# Patient Record
Sex: Male | Born: 1995
Health system: Southern US, Community
[De-identification: ages and names within clinical notes are randomized; demographics above are authoritative.]

## PROBLEM LIST (undated history)

## (undated) HISTORY — PX: WISDOM TOOTH EXTRACTION: SHX21

---
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
## 2017-11-07 ENCOUNTER — Encounter: Payer: Self-pay | Admitting: Neurology

## 2018-01-19 ENCOUNTER — Encounter: Payer: Self-pay | Admitting: Neurology

## 2018-01-19 ENCOUNTER — Other Ambulatory Visit: Payer: Self-pay

## 2018-01-19 ENCOUNTER — Other Ambulatory Visit: Payer: BC Managed Care – PPO

## 2018-01-19 ENCOUNTER — Ambulatory Visit: Payer: BC Managed Care – PPO | Admitting: Neurology

## 2018-01-19 VITALS — BP 124/78 | HR 58 | Ht 66.0 in | Wt 175.0 lb

## 2018-01-19 DIAGNOSIS — R519 Headache, unspecified: Secondary | ICD-10-CM

## 2018-01-19 DIAGNOSIS — G47411 Narcolepsy with cataplexy: Secondary | ICD-10-CM | POA: Diagnosis not present

## 2018-01-19 DIAGNOSIS — R51 Headache: Secondary | ICD-10-CM

## 2018-01-19 NOTE — Patient Instructions (Addendum)
1. Bloodwork for CMP, TSH, CK  Your provider has requested that you have labwork completed today. Please go to [facility] ([id number]) on the second floor of this building before leaving the office today. You do not need to check in. If you are not called within 15 minutes please check with the front desk.   2. Schedule MRI brain with and without contrast.[facility] and they will call you directly to schedule your appt. They are located at [address] If you need to contact them directly please call [phone].   3. Schedule EMG/NCV of right UE and LE with Dr. Allena KatzPatel 4. Follow-up after tests

## 2018-01-19 NOTE — Progress Notes (Signed)
NEUROLOGY CONSULTATION NOTE  Leroy Herman MRN: [id number] DOB: Oct 19, 1996  Referring provider: Dr. Pati Gallo Primary care provider: Dr. Ronnette Juniper  Reason for consult:  Temporary paralysis  Dear Dr Farris Has:  Thank you for your kind referral of Leroy Herman for consultation of the above symptoms. Although his history is well known to you, please allow me to reiterate it for the purpose of our medical record. He is alone in the office today. Records and images were personally reviewed where available.   HISTORY OF PRESENT ILLNESS: This is a pleasant 22 year old right-handed man with no significant past medical history, presenting for evaluation of transient paralysis that only occurs when he is on the football field after a tackle. He had one or two at age 65 while playing football, then in the past 2 years has had 4 episodes. With all of the episodes, he would be tackling somebody and fall to the ground, once on the ground he feels stuck, "whole body shuts off," and he cannot move all his extremities for 5 seconds. It happened twice in one game last November 2018, with the first one he got up by himself after 5 seconds, with the second one he started to get up as the coach was coming toward him. He has not tried to talk, he denies any confusion and knows what is going on but feels frozen. He is normal soon after and able to continue playing. He denies any muscle cramps or twitching, no focal numbness/tingling. It does not happen each time he is tackled, there are no clear triggers (ie prior food intake, sleep). He has noticed memory issues since November 2018, he is able to keep up his grades but has difficulty remembering things, stuttering, having more mooc changes. He has had sleep difficulties since Spring break, usually getting only 2-3 hours of sleep at night. No daytime drowsiness. He denies any sleep paralysis, hallucinations. No family history of similar symptoms. He started  noticing more headaches as well since November, with a headache at least every 2 days. He describes a throbbing ache in the frontal region lasting for a few hours, no nausea/vomiting, photo/phonophobia, visual obscurations. He is able to function with the headaches. No dizziness, diplopia, bowel/bladder dysfunction. He has occasional neck and back pain. He saw Ortho for these episodes and had an MRI cervical spine in 09/2017 which was normal.    PAST MEDICAL HISTORY: History reviewed. No pertinent past medical history.  PAST SURGICAL HISTORY: Past Surgical History:  Procedure Laterality Date  . WISDOM TOOTH EXTRACTION      MEDICATIONS: Current Outpatient Medications on File Prior to Visit  Medication Sig Dispense Refill  . ibuprofen (ADVIL,MOTRIN) 800 MG tablet Take 1 tablet (800 mg total) by mouth 3 (three) times daily. 50 tablet 0  . Loratadine (CLARITIN) 10 MG CAPS Take by mouth.     No current facility-administered medications on file prior to visit.     ALLERGIES: No Known Allergies  FAMILY HISTORY: History reviewed. No pertinent family history.  SOCIAL HISTORY: Social History   Socioeconomic History  . Marital status: Single    Spouse name: Not on file  . Number of children: Not on file  . Years of education: Not on file  . Highest education level: Not on file  Social Needs  . Financial resource strain: Not on file  . Food insecurity - worry: Not on file  . Food insecurity - inability: Not on file  . Transportation  needs - medical: Not on file  . Transportation needs - non-medical: Not on file  Occupational History  . Not on file  Tobacco Use  . Smoking status: Never Smoker  . Smokeless tobacco: Never Used  Substance and Sexual Activity  . Alcohol use: No  . Drug use: Not on file  . Sexual activity: Not on file  Other Topics Concern  . Not on file  Social History Narrative   Pt lives in college dorm at [employer]   Has 3 dorm-mates (single room  occupancy 4 total to dorm)    REVIEW OF SYSTEMS: Constitutional: No fevers, chills, or sweats, no generalized fatigue, change in appetite Eyes: No visual changes, double vision, eye pain Ear, nose and throat: No hearing loss, ear pain, nasal congestion, sore throat Cardiovascular: No chest pain, palpitations Respiratory:  No shortness of breath at rest or with exertion, wheezes GastrointestinaI: No nausea, vomiting, diarrhea, abdominal pain, fecal incontinence Genitourinary:  No dysuria, urinary retention or frequency Musculoskeletal:  No neck pain, back pain Integumentary: No rash, pruritus, skin lesions Neurological: as above Psychiatric: No depression, insomnia, anxiety Endocrine: No palpitations, fatigue, diaphoresis, mood swings, change in appetite, change in weight, increased thirst Hematologic/Lymphatic:  No anemia, purpura, petechiae. Allergic/Immunologic: no itchy/runny eyes, nasal congestion, recent allergic reactions, rashes  PHYSICAL EXAM: Vitals:   01/19/18 1026  BP: 124/78  Pulse: (!) 58  SpO2: 98%   General: No acute distress Head:  Normocephalic/atraumatic Eyes: Fundoscopic exam shows bilateral sharp discs, no vessel changes, exudates, or hemorrhages Neck: supple, no paraspinal tenderness, full range of motion Back: No paraspinal tenderness Heart: regular rate and rhythm Lungs: Clear to auscultation bilaterally. Vascular: No carotid bruits. Skin/Extremities: No rash, no edema Neurological Exam: Mental status: alert and oriented to person, place, and time, no dysarthria or aphasia, Fund of knowledge is appropriate.  Recent and remote memory are intact.  Attention and concentration are normal.    Able to name objects and repeat phrases. Cranial nerves: CN I: not tested CN II: pupils equal, round and reactive to light, visual fields intact, fundi unremarkable. CN III, IV, VI:  full range of motion, no nystagmus, no ptosis CN V: facial sensation intact CN VII:  upper and lower face symmetric CN VIII: hearing intact to finger rub CN IX, X: gag intact, uvula midline CN XI: sternocleidomastoid and trapezius muscles intact CN XII: tongue midline Bulk & Tone: normal, no fasciculations. Motor: 5/5 throughout with no pronator drift. Sensation: intact to light touch, cold, pin, vibration and joint position sense.  No extinction to double simultaneous stimulation.  Romberg test negative Deep Tendon Reflexes: +2 throughout, no ankle clonus Plantar responses: downgoing bilaterally Cerebellar: no incoordination on finger to nose, heel to shin. No dysdiadochokinesia Gait: narrow-based and steady, able to tandem walk adequately. Tremor: none  IMPRESSION: This is a pleasant 22 year old right-handed man presenting with recurrent episodes of transient paralysis when he is tackled to the ground playing football. These episodes have not occurred at any other times, and do not occur all the time he is tackled to the ground. His neurological exam is normal. Etiology of symptoms unclear, considerations include periodic paralysis from a channelopathy, although paralysis with these conditions tend to be more prolonged than 5 seconds. Check CMP, CK, TSH. EMG/NCV of the right UE and LE will be ordered to assess for neuropathy/myopathy. Other considerations include cataplexy, he has no other symptoms of narcolepsy, ?cardiac cause in a young athlete, although he does not seem to  have any other cardiac symptoms. He is also reporting new onset headaches and memory changes, MRI brain with and without contrast will be ordered to assess for underlying structural abnormality. He will follow-up after the tests.  Thank you for allowing me to participate in the care of this patient. Please do not hesitate to call for any questions or concerns.   Patrcia Dolly, M.D.  CC: Dr. Farris Has, Dr. Tracey Harries

## 2018-01-20 LAB — COMPREHENSIVE METABOLIC PANEL
AG Ratio: 1.9 (calc) (ref 1.0–2.5)
ALKALINE PHOSPHATASE (APISO): 57 U/L (ref 48–230)
ALT: 35 U/L (ref 8–46)
AST: 25 U/L (ref 12–32)
Albumin: 4.8 g/dL (ref 3.6–5.1)
BUN: 16 mg/dL (ref 7–20)
CO2: 31 mmol/L (ref 20–32)
Calcium: 10 mg/dL (ref 8.9–10.4)
Chloride: 102 mmol/L (ref 98–110)
Creat: 1.02 mg/dL (ref 0.60–1.26)
Globulin: 2.5 g/dL (calc) (ref 2.1–3.5)
Glucose, Bld: 88 mg/dL (ref 65–99)
Potassium: 4.3 mmol/L (ref 3.8–5.1)
Sodium: 139 mmol/L (ref 135–146)
Total Bilirubin: 0.5 mg/dL (ref 0.2–1.1)
Total Protein: 7.3 g/dL (ref 6.3–8.2)

## 2018-01-20 LAB — CK: CK TOTAL: 239 U/L — AB (ref 44–196)

## 2018-01-20 LAB — TSH: TSH: 2.12 m[IU]/L (ref 0.50–4.30)

## 2018-01-27 ENCOUNTER — Ambulatory Visit (INDEPENDENT_AMBULATORY_CARE_PROVIDER_SITE_OTHER): Payer: BC Managed Care – PPO | Admitting: Neurology

## 2018-01-27 DIAGNOSIS — G47411 Narcolepsy with cataplexy: Secondary | ICD-10-CM | POA: Diagnosis not present

## 2018-01-27 DIAGNOSIS — R519 Headache, unspecified: Secondary | ICD-10-CM

## 2018-01-27 DIAGNOSIS — R531 Weakness: Secondary | ICD-10-CM

## 2018-01-27 DIAGNOSIS — R51 Headache: Secondary | ICD-10-CM

## 2018-01-27 NOTE — Procedures (Signed)
[facility]  [address] [place], [address]  [place], [place] [address] Tel: [phone] Fax:  [phone] Test Date:  01/27/2018  Patient: Leroy Herman DOB: 1996/07/28 Physician: Nita Sickle, DO  Sex: Male Height: 5\' 6"  Ref Phys: Patrcia Dolly, MD  ID#: [id number] Temp: 32.4C Technician:    Patient Complaints: This is a 22 year old man referred for evaluation of transient muscle weakness and falls.  NCV & EMG Findings: Extensive electrodiagnostic testing of the right upper and lower extremity shows:  1. All sensory responses including the right median, ulnar, sural, and superficial peroneal nerves are within normal limits. 2. All motor responses including the right median, ulnar, peroneal, and tibial nerves are within normal limits. 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 3. Right tibial H reflex study is within normal limits. 4. There is no evidence of active or chronic motor axon loss changes affecting any of the tested muscles. Motor unit configuration and recruitment pattern is within normal limits.  Impression: This is a normal study of the right side. In particular, there is no evidence of a diffuse myopathy, sensorimotor polyneuropathy, or cervical/lumbosacral radiculopathy.  Incidentally, there is a right Martin-Gruber anastomosis, a normal variant.   ___________________________ Nita Sickle, DO    Nerve Conduction Studies Anti Sensory Summary Table   Site NR Peak (ms) Norm Peak (ms) P-T Amp (V) Norm P-T Amp  Right Median Anti Sensory (2nd Digit)  32.4C  Wrist    2.6 <3.3 44.8 >20  Right Sup Peroneal Anti Sensory (Ant Lat Mall)  32.4C  12 cm    2.6 <4.4 24.5 >6  Right Sural Anti Sensory (Lat Mall)  32.4C  Calf    2.7 <4.4 15.2 >6  Right Ulnar Anti Sensory (5th Digit)  32.4C    Wrist    2.4 <3.0 22.0 >18   Motor Summary Table   Site NR Onset (ms) Norm Onset (ms) O-P Amp (mV) Norm O-P Amp Site1 Site2 Delta-0 (ms) Dist (cm) Vel (m/s) Norm Vel (m/s)  Right Median Motor (Abd Poll Brev)  32.4C  Wrist    2.7 <3.9 7.4 >6 Elbow Wrist 4.1 26.0 63 >51  Elbow    6.8  8.2  Ulnar-wrist crossover Elbow 2.7 0.0    Ulnar-wrist crossover    4.1  2.9         Right Peroneal Motor (Ext Dig Brev)  32.4C  Ankle    2.8 <5.5 7.5 >3 B Fib Ankle 6.8 38.0 56 >41  B Fib    9.6  6.6  Poplt B Fib 1.3 8.0 62 >41  Poplt    10.9  6.3         Right Peroneal TA Motor (Tib Ant)  32.4C  Fib Head    1.8 <4.0 8.2 >4 Poplit Fib Head 1.4 8.0 57 >41  Poplit    3.2  8.2         Right Tibial Motor (Abd Hall Brev)  32.4C  Ankle    2.7 <5.8 15.0 >8 Knee Ankle 7.5 40.0 53 >41  Knee    10.2  12.1         Right Ulnar Motor (Abd Dig Minimi)  32.4C  Wrist    2.3 <3.0 10.6 >8 B Elbow Wrist 3.6 22.0 61 >51  B Elbow    5.9  9.7  A  Elbow B Elbow 1.8 10.0 56 >51  A Elbow    7.7  9.8          H Reflex Studies   NR H-Lat (ms) Lat Norm (ms) L-R H-Lat (ms)  Right Tibial (Gastroc)  32.4C     30.20 <35    EMG   Side Muscle Ins Act Fibs Psw Fasc Number Recrt Dur Dur. Amp Amp. Poly Poly. Comment  Right 1stDorInt Nml Nml Nml Nml Nml Nml Nml Nml Nml Nml Nml Nml N/A  Right PronatorTeres Nml Nml Nml Nml Nml Nml Nml Nml Nml Nml Nml Nml N/A  Right Biceps Nml Nml Nml Nml Nml Nml Nml Nml Nml Nml Nml Nml N/A  Right Triceps Nml Nml Nml Nml Nml Nml Nml Nml Nml Nml Nml Nml N/A  Right Deltoid Nml Nml Nml Nml Nml Nml Nml Nml Nml Nml Nml Nml N/A  Right AntTibialis Nml Nml Nml Nml Nml Nml Nml Nml Nml Nml Nml Nml N/A  Right Gastroc Nml Nml Nml Nml Nml Nml Nml Nml Nml Nml Nml Nml N/A  Right Flex Dig Long Nml Nml Nml Nml Nml Nml Nml Nml Nml Nml Nml Nml N/A  Right RectFemoris Nml Nml Nml Nml Nml Nml Nml Nml Nml Nml Nml Nml N/A  Right GluteusMed Nml Nml Nml Nml Nml Nml Nml Nml Nml Nml Nml Nml N/A      Waveforms:

## 2018-02-04 ENCOUNTER — Telehealth: Payer: Self-pay | Admitting: Neurology

## 2018-02-04 ENCOUNTER — Ambulatory Visit
Admission: RE | Admit: 2018-02-04 | Discharge: 2018-02-04 | Disposition: A | Discharge status: Home / Self Care | Payer: BC Managed Care – PPO | Source: Ambulatory Visit | Attending: Neurology | Admitting: Neurology

## 2018-02-04 DIAGNOSIS — R51 Headache: Secondary | ICD-10-CM

## 2018-02-04 DIAGNOSIS — R519 Headache, unspecified: Secondary | ICD-10-CM

## 2018-02-04 DIAGNOSIS — G47411 Narcolepsy with cataplexy: Secondary | ICD-10-CM

## 2018-02-04 MED ORDER — GADOBENATE DIMEGLUMINE 529 MG/ML IV SOLN
16.0000 mL | Freq: Once | INTRAVENOUS | Status: AC | PRN
  Administered 2018-02-04: 16 mL via INTRAVENOUS

## 2018-02-04 NOTE — Telephone Encounter (Signed)
Pt left a VM message saying he finished all his tests and wanted to schedule an appointment, next available is in October, please advise

## 2018-02-05 NOTE — Telephone Encounter (Addendum)
Notes say to follow up after tests.  First open slot is October, absolute first available is an urgent work in morning on May 23rd. Let me know and I will schedule. Also, MRI impression suggests pt have EEG.

## 2018-02-05 NOTE — Telephone Encounter (Signed)
ptp scheduled for follow up tomorrow at 11:30am

## 2018-02-05 NOTE — Telephone Encounter (Signed)
Ok for 11:30am slot tomorrow. Thanks

## 2018-02-06 ENCOUNTER — Ambulatory Visit: Payer: BC Managed Care – PPO | Admitting: Neurology

## 2018-02-06 ENCOUNTER — Encounter: Payer: Self-pay | Admitting: Neurology

## 2018-02-06 VITALS — BP 118/62 | HR 76 | Resp 16 | Ht 66.0 in | Wt 176.0 lb

## 2018-02-06 DIAGNOSIS — Q279 Congenital malformation of peripheral vascular system, unspecified: Secondary | ICD-10-CM | POA: Diagnosis not present

## 2018-02-06 DIAGNOSIS — R413 Other amnesia: Secondary | ICD-10-CM | POA: Diagnosis not present

## 2018-02-06 DIAGNOSIS — G47411 Narcolepsy with cataplexy: Secondary | ICD-10-CM

## 2018-02-06 NOTE — Progress Notes (Signed)
NEUROLOGY FOLLOW UP OFFICE NOTE  Leroy Herman [id number] 10-20-1996  HISTORY OF PRESENT ILLNESS: I had the pleasure of seeing Leroy Herman in follow-up in the neurology clinic on 02/06/2018.  The patient was last seen 2 weeks ago for recurrent episodes of transient quadriplegia occurring when he is tackled playing football. He was also reporting daily headaches, memory loss, and speech difficulties. Records and images were personally reviewed where available.  Bloodwork showed mildly elevated CK 239, normal CMP and TSH. His EMG/NCV of the right arm and leg were normal, no evidence of myopathy, neuropathy, or cervical/lumbar radiculopathy. 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 He does not have any staring/unresponsive episodes, olfactory/gustatory hallucinations, focal numbness/tingling/weakness. He reports the headaches are not as bad, they are not occurring as frequently. He continues to note memory issues, stating he does well on tests, but stutters a lot when talking to his friends, he has difficulties getting words out. He forgets his age when asked, or forgets what he did yesterday. He has not had any episodes of transient quadriplegia since last visit, he thought he may have an episode one time when he hit a football dummy, but it did not progress.  HPI 01/19/2018: This is a pleasant 22 yo RH man with no significant past medical history, who presented for evaluation of transient paralysis that only occurs when he is on the football field after a tackle. He had one or two at age 22 while playing football, then in the past 20 years has had 4 episodes. With all of the episodes, he would be tackling somebody and fall to the ground, once on the ground he feels stuck, "whole body shuts off," and he cannot  move all his extremities for 5 seconds. It happened twice in one game last November 2018, with the first one he got up by himself after 5 seconds, with the second one he started to get up as the coach was coming toward him. He has not tried to talk, he denies any confusion and knows what is going on but feels frozen. He is normal soon after and able to continue playing. He denies any muscle cramps or twitching, no focal numbness/tingling. It does not happen each time he is tackled, there are no clear triggers (ie prior food intake, sleep). He has noticed memory issues since November 2018, he is able to keep up his grades but has difficulty remembering things, stuttering, having more mood changes. He has had sleep difficulties since Spring break, usually getting only 2-3 hours of sleep at night. No daytime drowsiness. He denies any sleep paralysis, hallucinations. No family history of similar symptoms. He started noticing more headaches as well since November, with a headache at least every 2 days. He describes a throbbing ache in the frontal region lasting for a few hours, no nausea/vomiting, photo/phonophobia, visual obscurations. He is able to function with the headaches. No dizziness, diplopia, bowel/bladder dysfunction. He has occasional neck and back pain. He saw Ortho for these episodes and had an MRI cervical spine in 09/2017 which was normal.    PAST MEDICAL HISTORY: History reviewed. No pertinent past medical history.  MEDICATIONS: Current Outpatient Medications on File Prior to Visit  Medication Sig Dispense Refill  . ibuprofen (ADVIL,MOTRIN) 800 MG tablet Take 1 tablet (800 mg total) by mouth 3 (  three) times daily. 50 tablet 0  . Loratadine (CLARITIN) 10 MG CAPS Take by mouth.     No current facility-administered medications on file prior to visit.     ALLERGIES: No Known Allergies  FAMILY HISTORY: History reviewed. No pertinent family history.  SOCIAL HISTORY: Social History    Socioeconomic History  . Marital status: Single    Spouse name: Not on file  . Number of children: Not on file  . Years of education: Not on file  . Highest education level: Not on file  Occupational History  . Not on file  Social Needs  . Financial resource strain: Not on file  . Food insecurity:    Worry: Not on file    Inability: Not on file  . Transportation needs:    Medical: Not on file    Non-medical: Not on file  Tobacco Use  . Smoking status: Never Smoker  . Smokeless tobacco: Never Used  Substance and Sexual Activity  . Alcohol use: No  . Drug use: Not on file  . Sexual activity: Not on file  Lifestyle  . Physical activity:    Days per week: Not on file    Minutes per session: Not on file  . Stress: Not on file  Relationships  . Social connections:    Talks on phone: Not on file    Gets together: Not on file    Attends religious service: Not on file    Active member of club or organization: Not on file    Attends meetings of clubs or organizations: Not on file    Relationship status: Not on file  . Intimate partner violence:    Fear of current or ex partner: Not on file    Emotionally abused: Not on file    Physically abused: Not on file    Forced sexual activity: Not on file  Other Topics Concern  . Not on file  Social History Narrative   Pt lives in college dorm at [employer]   Has 3 dorm-mates (single room occupancy 4 total to dorm)    REVIEW OF SYSTEMS: Constitutional: No fevers, chills, or sweats, no generalized fatigue, change in appetite Eyes: No visual changes, double vision, eye pain Ear, nose and throat: No hearing loss, ear pain, nasal congestion, sore throat Cardiovascular: No chest pain, palpitations Respiratory:  No shortness of breath at rest or with exertion, wheezes GastrointestinaI: No nausea, vomiting, diarrhea, abdominal pain, fecal incontinence Genitourinary:  No dysuria, urinary retention or frequency Musculoskeletal:   No neck pain, back pain Integumentary: No rash, pruritus, skin lesions Neurological: as above Psychiatric: No depression, insomnia, anxiety Endocrine: No palpitations, fatigue, diaphoresis, mood swings, change in appetite, change in weight, increased thirst Hematologic/Lymphatic:  No anemia, purpura, petechiae. Allergic/Immunologic: no itchy/runny eyes, nasal congestion, recent allergic reactions, rashes  PHYSICAL EXAM: Vitals:   02/06/18 1136  BP: 118/62  Pulse: 76  Resp: 16  SpO2: 98%   General: No acute distress Head:  Normocephalic/atraumatic Neck: supple, no paraspinal tenderness, full range of motion Heart:  Regular rate and rhythm Lungs:  Clear to auscultation bilaterally Back: No paraspinal tenderness Skin/Extremities: No rash, no edema Neurological Exam: alert and oriented to person, place, and time. No aphasia or dysarthria. Fund of knowledge is appropriate.  Recent and remote memory are intact.  Attention and concentration are normal.    Able to name objects and repeat phrases. Cranial nerves: Pupils equal, round, reactive to light.  Extraocular movements intact with no nystagmus.  Visual fields full. Facial sensation intact. No facial asymmetry. Tongue, uvula, palate midline.  Motor: Bulk and tone normal, muscle strength 5/5 throughout with no pronator drift.  Sensation to light touch intact.  No extinction to double simultaneous stimulation.  Deep tendon reflexes 2+ throughout, toes downgoing.  Finger to nose testing intact.  Gait narrow-based and steady, able to tandem walk adequately.  Romberg negative.  IMPRESSION: This is a pleasant 22 yo RH man presenting with recurrent episodes of transient paralysis when he is tackled to the ground playing football. These episodes have not occurred at any other times, and do not occur all the time he is tackled to the ground. He was also reporting frequent headaches and memory/stuttering/mood changes. His neurological exam is normal. He  has had an MRI cervical spine through Ortho reported as normal. EMG/NCV normal, no evidence of myopathy, neuropathy, or radiculopathy. His CK was mildly elevated. He had an MRI brain which showed an incidental finding of a right temporal developmental venous anomaly, which would not cause his symptoms. 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 He will be referred to Sports Medicine specialist Dr. Antoine PrimasZachary Smith for further evaluation and management. We discussed the headaches and memory/stuttering issues. Headaches are better, continue to monitor, he was advised to keep a headache calendar and minimize rescue medications to 2-3 a week to avoid rebound headaches. He will be scheduled for Neurocognitive testing. We also discussed the MRI findings, an EEG will be ordered for completion, however it is unlikely that any of his symptoms are seizure-related. He will follow-up after Neurocognitive testing and knows to call for any changes.   Thank you for allowing me to participate in his care.  Please do not hesitate to call for any questions or concerns.  The duration of this appointment visit was 25 minutes of face-to-face time with the patient.  Greater than 50% of this time was spent in counseling, explanation of diagnosis, planning of further management, and coordination of care.   Patrcia DollyKaren Aquino, M.D.   CC: Dr. Tracey HarriesPringle, Dr. Pati GalloJames Kramer

## 2018-02-06 NOTE — Patient Instructions (Addendum)
1. Schedule routine EEG 2. Schedule Neurocognitive testing 3. Refer to Dr. Antoine PrimasZachary Smith for cervical cord neuropraxia. They will call you directly with an appt. If you do not hear from them they can be reached at [phone]. 4. Keep a calendar of your headaches. 5. Follow-up in 5 months, call for any changes  Cervical cord neurapraxia-Cervical cord neurapraxia (CCN), also known as transient quadriparesis or transient neurapraxia, is a rare cervical spine injury that is often considered a "concussion" of the cervical spinal cord [21]. It is thought to be caused by axial loading of the neck in flexion or extension. CCN occurs most commonly in football players who lead with their heads when hitting an opposing player. In the [employer] [place]), the incidence is 1.3 per 10,000 athletes per season [22].  Clinical features-The affected athlete has sudden posttraumatic onset of bilateral sensory, motor, or combined neurologic deficit. Sensory findings include burning, numbness, tingling, or loss of sensation; motor findings include weakness or complete paralysis [23,24]. Burning hands syndrome, a variant of this central cord injury, consists only of upper-extremity symptoms, most notably temporary burning dysesthesias and weakness of the arms and hands [25-27]. By definition, the symptoms of CCN are transient and completely resolve within 10 minutes to 48 hours. CCN should be distinguished from cervical "burners" (unilateral paresis of an upper extremity, often associated with neck pain) [28]. (See 'Cervical burners' below.)

## 2018-02-10 ENCOUNTER — Encounter: Payer: Self-pay | Admitting: Psychology

## 2018-02-10 ENCOUNTER — Ambulatory Visit: Payer: BC Managed Care – PPO | Admitting: Psychology

## 2018-02-10 DIAGNOSIS — R413 Other amnesia: Secondary | ICD-10-CM

## 2018-02-10 DIAGNOSIS — S060X0S Concussion without loss of consciousness, sequela: Secondary | ICD-10-CM

## 2018-02-10 DIAGNOSIS — G47411 Narcolepsy with cataplexy: Secondary | ICD-10-CM

## 2018-02-10 NOTE — Progress Notes (Signed)
NEUROBEHAVIORAL STATUS EXAM   Name: Leroy Herman Date of Birth: 30-Dec-1995 Date of Interview: 02/10/2018  Reason for Referral:  YOANDRI CONGROVE is a 22 y.o. male who is referred for neuropsychological evaluation by Dr. Patrcia Dolly of [facility] due to concerns about cognitive changes. This patient is unaccompanied in the office for today's visit.  History of Presenting Problem:  Mr. Baugher was seen by Dr. Karel Jarvis for on 01/19/2018 for neurologic evaluation of transient paralysis (approx 5 seconds) that only occurs when he is on the football field after a tackle. Episodes of paralysis have not occurred at any other times, and do not occur every time he is tackled to the ground. His neurological exam was normal. Etiology of symptoms was unclear, considerations included periodic paralysis from a channelopathy, although paralysis with these conditions tend to be more prolonged than 5 seconds. Other considerations included cataplexy, he has no other symptoms of narcolepsy, ?cardiac cause in a young athlete, although he does not seem to have any other cardiac symptoms. He also reported new onset headaches and speech and memory changes. MRI brain with and without contrast was completed on 02/04/2018 and was essentially unremarkable with incidental finding of right temporal lobe developmental venous anomaly (which would not cause his symptoms per Dr. Karel Jarvis). Radiologist recommended EEG, and this was ordered by Dr. Karel Jarvis (although she stated it is unlikely that any of his symptoms are seizure related), and will be completed on 02/12/2018. He had EMG/NCV of the right arm and leg which was essentially normal with no evidence of myopathy, neuropathy, or cervical/lumbar radiculopathy. The patient followed up with Dr. Karel Jarvis on 02/06/2018. He reported his headaches were not as bad, not occurring as frequently. He continued to note memory issues and stuttering speech. Dr. Karel Jarvis discussed with the patient the  possibility of cervical cord neuropraxia where patients have transient quadriparesis, most commonly occurring in football players. He was referred to Sports Medicine specialist Dr. Antoine Primas for further evaluation and management.   At today's appointment (02/10/2018), the patient reports concerns about memory retrieval, stuttering speech, and mood changes.  --He reports that stuttering started gradually sometime within the past 1 1/2 years while at college (was not happening before college) and has progressively worsened over time. He reported that it is sometimes so bad that he cannot get a sentence out. It does not occur all the time. He has noticed it when speaking to his friends but not necessarily when speaking to his family. It also happens when he is playing live video games and speaking to other players via his microphone. He denies actual word finding difficulty, he knows exactly what word he is trying to say but keeps stuttering and can't get it out. His friends definitely notice it. --He also reports difficulty retrieving information that he knows well. For example, when asked his age, he may draw a complete blank and it takes him a minute to come up with the answer. He does always recall the accurate answer, though. He also reports slower retrieval of memories for recent conversations and events. In conversation with his friends, they may refer to a recent event that took place and he will at first have no memory of it and not know what they are referring to, but it does eventually come back to him.  --With regard to mood, he reports he is "easy to set off". He reports that a feeling of anger or irritability will come over him out of nowhere  for no identifiable reason. For example, he was recently hanging out with friends, "just chilling", and suddenly he felt angry for no reason and just left. He experiences the anger internally and does not act on it or show it externally. He admits that he has  experienced some depression in the past 1 1/2 years in college. He has no prior history of depression or diagnosed psychiatric disorder. He has no prior history of mental health treatment. He reported that he felt down and depressed when many of his friends left his college. He reported that there isn't much to do on his school's campus. He noted that he feels he is a "really negative person", always seeing "the cup half empty". He lets little things bother him, like the fact that his college doesn't keep up on landscaping of the campus. In the past 1 1/2 years, he has experienced some fleeting, passive suicidal ideation (thoughts of wishing he was dead) but no suicidal intention or plan. He denies any suicidal ideation, intention or plan at the present time. He reports his mood has been better lately because he is spending more time with friends and being social. The patient also admits to some anxiety related to being around new people/meeting new people. He also has some anxiety when driving to new places. He hates driving alone.    Upon direct questioning, the patient reported the following with regard to current cognitive functioning:   Forgetting recent conversations/events: Yes initially but then it comes back to him.  Misplacing/losing items: No change  Forgetting appointments or other obligations: No Forgetting to take medications: N/A  Difficulty concentrating: Maybe a little bit in class, but not when studying/doing homework Distracted easily: No  Word-finding difficulty: No Word substitutions:No Writing difficulty: No Spelling difficulty: No Comprehension difficulty: No  Making wrong turns when driving: No Uncertain about directions when driving or passenger: Yes (he is not from here), uses GPS   The patient denies any history of learning difficulty or ADHD. He has always been a good student achieving good grades. He is currently a sophomore at [employer] where his major is  [employer] and [employer] 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 He reports history of 3 diagnosed concussions (one in high school and two in college). The one in high school was caused by him hitting the ground (not a tackle or direct contact with another player). There was no loss of consciousness. He did have light sensitivity and headaches afterwards. He has never lost consciousness in the context of concussion or otherwise. The 2 diagnosed concussions in college were related to brief episode of paralysis as described earlier, there was no disorientation, altered consciousness, etc.   He continues to have occasional headaches. Most recent one was about 2-3 days ago. They do not prevent him from doing things. No nausea or vomiting is associated with headaches. Time duration of headache is variable.  He reports he was having insomnia when he first saw Dr. Karel Jarvis but he thinks that was related to adjusting schedule after spring break. He is no longer having insomnia. He has no problem falling asleep. He gets 6-8 hours of sleep per night.    Social History: Born/Raised: . Raised by both parents. No siblings.  Education: Currently finishing up sophomore year at [employer]. Good grades. Marital history: Single. Not dating or seeing anyone. Alcohol: Hardly ever, no binge drinking Tobacco: No, never a user SA: None, and no history  of substance abuse or dependence.   Medical History: No past medical history on file.    Current Medications:  Outpatient Encounter Medications as of 02/10/2018  Medication Sig  . ibuprofen (ADVIL,MOTRIN) 800 MG tablet Take 1 tablet (800 mg total) by mouth 3 (three) times daily.  . Loratadine (CLARITIN) 10 MG CAPS Take by mouth.   No facility-administered encounter medications on file as of 02/10/2018.      Behavioral Observations:   Appearance: Casually and appropriately dressed and groomed Gait: Ambulated  independently, no gross abnormalities observed Speech: Fluent; normal rate, rhythm and volume. No word finding difficulty. No stuttering during the interview but he did demonstrate increased response latencies. Thought process: Linear, goal directed Affect: Full, appears euthymic Interpersonal: Pleasant, appropriate   40 minutes spent face-to-face with patient completing neurobehavioral status exam. 40 minutes spent integrating medical records/clinical data and completing this report. CPT code [id number] unit.   TESTING: There is medical necessity to proceed with neuropsychological assessment as the results will be used to aid in differential diagnosis and clinical decision-making and to inform specific treatment recommendations. Per the patient and medical records reviewed, there has been a change in cognitive functioning and a reasonable suspicion of neurocognitive disorder.  Clinical Decision Making: In considering the patient's current level of functioning, level of presumed impairment, nature of symptoms, emotional and behavioral responses during the interview, level of literacy, and observed level of motivation, a battery of tests was selected and communicated to the psychometrician.    PLAN: The patient will return next week to complete the above referenced full battery of neuropsychological testing with a psychometrician under my supervision. Education regarding testing procedures was provided to the patient. Subsequently, the patient will see this provider for a follow-up session at which time his test performances and my impressions and treatment recommendations will be reviewed in detail.  Evaluation ongoing; full report to follow.

## 2018-02-12 ENCOUNTER — Ambulatory Visit (INDEPENDENT_AMBULATORY_CARE_PROVIDER_SITE_OTHER): Payer: BC Managed Care – PPO | Admitting: Neurology

## 2018-02-12 DIAGNOSIS — G47411 Narcolepsy with cataplexy: Secondary | ICD-10-CM

## 2018-02-12 DIAGNOSIS — R413 Other amnesia: Secondary | ICD-10-CM

## 2018-02-12 DIAGNOSIS — Q279 Congenital malformation of peripheral vascular system, unspecified: Secondary | ICD-10-CM

## 2018-02-16 NOTE — Procedures (Signed)
ELECTROENCEPHALOGRAM REPORT  Date of Study: 02/12/2018  Patient's Name: Gershon CraneStephen T Dargis MRN: [id number] Date of Birth: 1996-04-04  Referring Provider: Dr. Patrcia DollyKaren Aquino  Clinical History: This is a 22 year old man with MRI brain showing right temporal DVA, memory loss.  Medications: Claritin Advil  Technical Summary: A multichannel digital EEG recording measured by the international 10-20 system with electrodes applied with paste and impedances below 5000 ohms performed in our laboratory with EKG monitoring in an awake and asleep patient.  Hyperventilation and photic stimulation were performed.  The digital EEG was referentially recorded, reformatted, and digitally filtered in a variety of bipolar and referential montages for optimal display.    Description: The patient is awake and asleep during the recording.  During maximal wakefulness, there is a symmetric, medium voltage 11 Hz posterior dominant rhythm that attenuates with eye opening.  The record is symmetric.  During drowsiness and sleep, there is an increase in theta slowing of the background.  Vertex waves and symmetric sleep spindles were seen.  Hyperventilation and photic stimulation did not elicit any abnormalities.  There were no epileptiform discharges or electrographic seizures seen.    EKG lead was unremarkable.  Impression: This awake and asleep EEG is normal.    Clinical Correlation: A normal EEG does not exclude a clinical diagnosis of epilepsy.  If further clinical questions remain, prolonged EEG may be helpful.  Clinical correlation is advised.   Patrcia DollyKaren Aquino, M.D.

## 2018-02-17 ENCOUNTER — Telehealth: Payer: Self-pay

## 2018-02-17 ENCOUNTER — Ambulatory Visit (INDEPENDENT_AMBULATORY_CARE_PROVIDER_SITE_OTHER): Payer: BC Managed Care – PPO | Admitting: Psychology

## 2018-02-17 DIAGNOSIS — R413 Other amnesia: Secondary | ICD-10-CM

## 2018-02-17 NOTE — Telephone Encounter (Signed)
-----   Message from Van ClinesKaren M Aquino, MD sent at 02/16/2018  1:24 PM EDT ----- Pls let him know EEG was normal, thanks

## 2018-02-17 NOTE — Progress Notes (Addendum)
   Neuropsychology Note  Leroy Herman Tomasello completed 150 minutes of neuropsychological testing with technician, Wallace Kellerana Chamberlain, BS, under the supervision of Dr. Elvis CoilMaryBeth Bailar, Licensed Psychologist. The patient did not appear overtly distressed by the testing session, per behavioral observation or via self-report to the technician. Rest breaks were offered.   Clinical Decision Making: In considering the patient's current level of functioning, level of presumed impairment, nature of symptoms, emotional and behavioral responses during the interview, level of literacy, and observed level of motivation/effort, a battery of tests was selected and communicated to the psychometrician.  Communication between the psychologist and technician was ongoing throughout the testing session and changes were made as deemed necessary based on patient performance on testing, technician observations and additional pertinent factors such as those listed above.  Leroy Herman Golda will return within approximately 2 weeks for an interactive feedback session with Dr. Alinda DoomsBailar at which time his test performances, clinical impressions and treatment recommendations will be reviewed in detail. The patient understands he can contact our office should he require our assistance before this time.  20 minutes spent performing neuropsychological evaluation services/clinical decision making (psychologist). [CPT 96132] 150 minutes spent face-to-face with patient administering standardized tests, 60 minutes spent scoring (technician). [CPT [id number], 96139]  Full report to follow.

## 2018-02-17 NOTE — Telephone Encounter (Signed)
LMOM relaying message below.  

## 2018-02-24 NOTE — Progress Notes (Signed)
[facility] [address] [place], [place] [address] Phone: [phone] Subjective:    I'm seeing this patient by the request  of:  Aquino MD CC: Episode of muscle tone loss  OZH:[id number]  Leroy Herman is a 22 y.o. male coming in with complaint of loss of muscle tone and a transient paralysis after a tackle playing football.  Patient did have this happened on multiple occasions and had 4 episodes in the last year.  Each time with him tackling.  Patient states that last seconds.  Patient denies any hitting of his head or any confusion.  He does not think he loses consciousness.  Denies any muscle cramps or twitching that is associated with it.  Patient has not noticed any other triggers such as food intake or sleep.  Has had some difficulty with memory    Patient did have an MRI February 04, 2018.  This was independently visualized by me.  Does have a developmental venous anomaly that was concerning for complex partial seizure.  Patient did have an EMG and a nerve conduction study that was unremarkable. Patient also had an EEG that was unremarkable as well.  Laboratory workup including thyroid, complete metabolic panel as well as creatine kinase.  Unremarkable.  When tackling he loses sensation in his entire body but which is for less than 30 seconds. 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 Symptoms started senior year of high school. Patient does not experience symptoms with conditioning drills or other physical activity.  Patient does have videos.  When the video shows that patient did not attempt to tackle an individual who was jumping.  He did hit him with the right side of his head.  And went to the ground.  Upper extremities seem to be in somewhat of a decorticate position.  Patient did have difficulty getting up.  No past medical history on file. Past Surgical History:  Procedure Laterality Date  . WISDOM TOOTH EXTRACTION     Social History    Socioeconomic History  . Marital status: Single    Spouse name: Not on file  . Number of children: Not on file  . Years of education: Not on file  . Highest education level: Not on file  Occupational History  . Not on file  Social Needs  . Financial resource strain: Not on file  . Food insecurity:    Worry: Not on file    Inability: Not on file  . Transportation needs:    Medical: Not on file    Non-medical: Not on file  Tobacco Use  . Smoking status: Never Smoker  . Smokeless tobacco: Never Used  Substance and Sexual Activity  . Alcohol use: No  . Drug use: Not on file  . Sexual activity: Not on file  Lifestyle  . Physical activity:    Days per week: Not on file    Minutes per session: Not on file  . Stress: Not on file  Relationships  . Social connections:    Talks on phone: Not on file    Gets together: Not on file    Attends religious service: Not on file    Active member of club or organization: Not on file    Attends meetings of clubs or organizations: Not on file    Relationship status: Not on file  Other Topics Concern  . Not on file  Social History Narrative   Pt lives in college dorm at [employer]   Has 3 dorm-mates (single room occupancy 4 total to dorm)   No Known Allergies No family history on file.   Past medical history, social, surgical and family history all reviewed in electronic medical record.  No pertanent information unless stated regarding to the chief complaint.   Review of Systems:Review of systems updated and as accurate as of 02/25/18  No headache, visual changes, nausea, vomiting, diarrhea, constipation, dizziness, abdominal pain, skin rash, fevers, chills, night sweats, weight loss, swollen lymph nodes, body aches, joint swelling, muscle aches, chest pain, shortness of breath, mood changes.   Objective  Blood pressure 102/74, pulse 74, height 5\' 6"  (1.676 m), weight 178 lb (80.7 kg), SpO2 99 %. Systems examined below as of  02/25/18   General: No apparent distress alert and oriented x3 mood and affect normal, dressed appropriately.  HEENT: Pupils equal, extraocular movements intact  Respiratory: Patient's speak in full sentences and does not appear short of breath  Cardiovascular: No lower extremity edema, non tender, no erythema  Skin: Warm dry intact with no signs of infection or rash on extremities or on axial skeleton.  Abdomen: Soft nontender  Neuro: Cranial nerves II through XII are intact, neurovascularly intact in all extremities with 2+ DTRs and 2+ pulses.  Lymph: No lymphadenopathy of posterior or anterior cervical chain or axillae bilaterally.  Gait normal with good balance and coordination.  MSK:  Non tender with full range of motion and good stability and symmetric strength and tone of shoulders, elbows, wrist, hip, knee and ankles bilaterally.  Neck: Inspection unremarkable. No palpable stepoffs. Negative Spurling's maneuver. Full neck range of motion Grip strength and sensation normal in bilateral hands Strength good C4 to T1 distribution No sensory change to C4 to T1 Negative Hoffman sign bilaterally Reflexes normal    Impression and Recommendations:     This case required medical decision making of moderate complexity.      Note: This dictation was prepared with Dragon dictation along with smaller phrase technology. Any transcriptional errors that result from this process are unintentional.

## 2018-02-25 ENCOUNTER — Ambulatory Visit: Payer: BC Managed Care – PPO | Admitting: Family Medicine

## 2018-02-25 ENCOUNTER — Encounter: Payer: Self-pay | Admitting: Family Medicine

## 2018-02-25 ENCOUNTER — Other Ambulatory Visit (INDEPENDENT_AMBULATORY_CARE_PROVIDER_SITE_OTHER): Payer: BC Managed Care – PPO

## 2018-02-25 ENCOUNTER — Ambulatory Visit (INDEPENDENT_AMBULATORY_CARE_PROVIDER_SITE_OTHER)
Admission: RE | Admit: 2018-02-25 | Discharge: 2018-02-25 | Disposition: A | Discharge status: Home / Self Care | Payer: BC Managed Care – PPO | Source: Ambulatory Visit | Attending: Family Medicine | Admitting: Family Medicine

## 2018-02-25 VITALS — BP 102/74 | HR 74 | Ht 66.0 in | Wt 178.0 lb

## 2018-02-25 DIAGNOSIS — G47411 Narcolepsy with cataplexy: Secondary | ICD-10-CM | POA: Diagnosis not present

## 2018-02-25 DIAGNOSIS — M2550 Pain in unspecified joint: Secondary | ICD-10-CM

## 2018-02-25 DIAGNOSIS — M5412 Radiculopathy, cervical region: Secondary | ICD-10-CM

## 2018-02-25 LAB — COMPREHENSIVE METABOLIC PANEL
ALBUMIN: 4.5 g/dL (ref 3.5–5.2)
ALK PHOS: 67 U/L (ref 39–117)
ALT: 43 U/L (ref 0–53)
AST: 29 U/L (ref 0–37)
BUN: 17 mg/dL (ref 6–23)
CALCIUM: 9.7 mg/dL (ref 8.4–10.5)
CHLORIDE: 101 meq/L (ref 96–112)
CO2: 29 mEq/L (ref 19–32)
CREATININE: 1.01 mg/dL (ref 0.40–1.50)
GFR: 100.03 mL/min (ref >60.00)
Glucose, Bld: 90 mg/dL (ref 70–99)
Potassium: 4 mEq/L (ref 3.5–5.1)
Sodium: 139 mEq/L (ref 135–145)
Total Bilirubin: 0.5 mg/dL (ref 0.2–1.2)
Total Protein: 7.5 g/dL (ref 6.0–8.3)

## 2018-02-25 LAB — URIC ACID: Uric Acid, Serum: 6.9 mg/dL (ref 4.0–7.8)

## 2018-02-25 LAB — SEDIMENTATION RATE: Sed Rate: 3 mm/hr (ref 0–15)

## 2018-02-25 LAB — IBC PANEL
IRON: 79 ug/dL (ref 42–165)
Saturation Ratios: 24.5 % (ref 20.0–50.0)
TRANSFERRIN: 230 mg/dL (ref 212.0–360.0)

## 2018-02-25 LAB — VITAMIN D 25 HYDROXY (VIT D DEFICIENCY, FRACTURES): VITD: 24.73 ng/mL — ABNORMAL LOW (ref 30.00–100.00)

## 2018-02-25 NOTE — Patient Instructions (Signed)
Good to see you  I am sorry no great answer right now.  Try to get the MRI when you can.  I will get labs and xrays downstairs today to make sure we are not missing anything.  OK to do cardio and light lifting but no contact whatsoever until we figure  Try our exercises nad lets see what symptoms may occur.  See me again in 3 weeks.

## 2018-02-25 NOTE — Assessment & Plan Note (Signed)
Patient has not some difficulty over the course of time.  Patient states that this usually happens unfortunately after trauma.  Patient does lose complete muscle tone but does not feel like he loses any cognition at the time.  Has had some difficulty with memory of late.  Patient has had significant work-up with neurology.  Patient states that he does have an MRI but unfortunately we do not have this available of the cervical spine.  At this point I am concerned for some atlantoaxial instability.  Plan to feel that x-rays and flexion-extension would be beneficial.  Congenital spinal stenosis is also within the differential.  Laboratory work-up to rule out any type of electrolyte abnormality could be also helpful.  Patient given some home exercises for more stability of the neck.  At this point I would not feel comfortable with releasing him for any type of contact sport.  Patient understands that.  Patient will try these changes we will see what the work-up shows and follow-up with me again in 3 weeks

## 2018-02-26 LAB — CALCIUM, IONIZED: Calcium, Ion: 5.3 mg/dL (ref 4.8–5.6)

## 2018-02-26 LAB — PTH, INTACT AND CALCIUM
Calcium: 10 mg/dL (ref 8.6–10.3)
PTH: 33 pg/mL (ref 14–64)

## 2018-02-27 LAB — ANA: Anti Nuclear Antibody(ANA): NEGATIVE

## 2018-02-28 NOTE — Progress Notes (Signed)
NEUROPSYCHOLOGICAL EVALUATION   Name:    Leroy Herman  Date of Birth:   03-16-1996 Date of Interview:  02/10/2018 Date of Testing:  02/17/2018   Date of Feedback:  03/03/2018       Background Information:  Reason for Referral:  Leroy Herman is a 22 y.o. male referred by Dr. Patrcia Dolly to assess his current level of cognitive functioning and assist in differential diagnosis. The current evaluation consisted of a review of available medical records, an interview with the patient, and the completion of a neuropsychological testing battery. Informed consent was obtained.  Leroy Herman was seen by Dr. Karel Jarvis for on 01/19/2018 for neurologic evaluation of transient paralysis (approx 5 seconds) that only occurs when he is on the football field after a tackle. Episodes of paralysis have not occurred at any other times, and do not occur every time he is tackled to the ground. His neurological exam was normal. Etiology of symptoms was unclear, considerations included periodic paralysis from a channelopathy, although paralysis with these conditions tend to be more prolonged than 5 seconds. Other considerations included cataplexy, he has no other symptoms of narcolepsy, ?cardiac cause in a young athlete, although he does not seem to have any other cardiac symptoms. He also reported new onset headaches and speech and memory changes. MRI brain with and without contrast was completed on 02/04/2018 and was essentially unremarkable with incidental finding of right temporal lobe developmental venous anomaly (which would not cause his symptoms per Dr. Karel Jarvis). Radiologist recommended EEG, and this was ordered by Dr. Karel Jarvis (although she stated it is unlikely that any of his symptoms are seizure related), and will be completed on 02/12/2018. He had EMG/NCV of the right arm and leg which was essentially normal with no evidence of myopathy, neuropathy, or cervical/lumbar radiculopathy. The patient followed up with Dr.  Karel Jarvis on 02/06/2018. He reported his headaches were not as bad, not occurring as frequently. He continued to note memory issues and stuttering speech. Dr. Karel Jarvis discussed with the patient the possibility of cervical cord neuropraxia where patients have transient quadriparesis, most commonly occurring in football players. He was referred to Sports Medicine specialist Dr. Antoine Primas for further evaluation and management.   At today's appointment (02/10/2018), the patient reports concerns about memory retrieval, stuttering speech, and mood changes.  --He reports that stuttering started gradually sometime within the past 1 1/2 years while at college (was not happening before college) and has progressively worsened over time. He reported that it is sometimes so bad that he cannot get a sentence out. It does not occur all the time. He has noticed it when speaking to his friends but not necessarily when speaking to his family. It also happens when he is playing live video games and speaking to other players via his microphone. He denies actual word finding difficulty, he knows exactly what word he is trying to say but keeps stuttering and can't get it out. His friends definitely notice it. --He also reports difficulty retrieving information that he knows well. For example, when asked his age, he may draw a complete blank and it takes him a minute to come up with the answer. He does always recall the accurate answer, though. He also reports slower retrieval of memories for recent conversations and events. In conversation with his friends, they may refer to a recent event that took place and he will at first have no memory of it and not know what they are referring  to, but it does eventually come back to him.  --With regard to mood, he reports he is "easy to set off". He reports that a feeling of anger or irritability will come over him out of nowhere for no identifiable reason. For example, he was recently hanging  out with friends, "just chilling", and suddenly he felt angry for no reason and just left. He experiences the anger internally and does not act on it or show it externally. He admits that he has experienced some depression in the past 1 1/2 years in college. He has no prior history of depression or diagnosed psychiatric disorder. He has no prior history of mental health treatment. He reported that he felt down and depressed when many of his friends left his college. He reported that there isn't much to do on his school's campus. He noted that he feels he is a "really negative person", always seeing "the cup half empty". He lets little things bother him, like the fact that his college doesn't keep up on landscaping of the campus. In the past 1 1/2 years, he has experienced some fleeting, passive suicidal ideation (thoughts of wishing he was dead) but no suicidal intention or plan. He denies any suicidal ideation, intention or plan at the present time. He reports his mood has been better lately because he is spending more time with friends and being social. The patient also admits to some anxiety related to being around new people/meeting new people. He also has some anxiety when driving to new places. He hates driving alone.    Upon direct questioning, the patient reported the following with regard to current cognitive functioning:   Forgetting recent conversations/events: Yes initially but then it comes back to him.  Misplacing/losing items: No change  Forgetting appointments or other obligations: No Forgetting to take medications: N/A  Difficulty concentrating: Maybe a little bit in class, but not when studying/doing homework Distracted easily: No  Word-finding difficulty: No Word substitutions:No Writing difficulty: No Spelling difficulty: No Comprehension difficulty: No  Making wrong turns when driving: No Uncertain about directions when driving or passenger: Yes (he is not from here),  uses GPS   The patient denies any history of learning difficulty or ADHD. He has always been a good student achieving good grades. 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 He reports history of 3 diagnosed concussions (one in high school and two in college). The one in high school was caused by him hitting the ground (not a tackle or direct contact with another player). There was no loss of consciousness. He did have light sensitivity and headaches afterwards. He has never lost consciousness in the context of concussion or otherwise. The 2 diagnosed concussions in college were related to brief episode of paralysis as described earlier, there was no disorientation, altered consciousness, etc.   He continues to have occasional headaches. Most recent one was about 2-3 days ago. They do not prevent him from doing things. No nausea or vomiting is associated with headaches. Time duration of headache is variable.  He reports he was having insomnia when he first saw Dr. Karel Jarvis but he thinks that was related to adjusting schedule after spring break. He is no longer having insomnia. He has no problem falling asleep. He gets 6-8 hours of sleep per night.    Social History: Born/Raised: [place]. Raised by both parents.  No siblings.  Education: Currently finishing up sophomore year at [employer]. Good grades. Marital history: Single. Not dating or seeing anyone. Alcohol: Hardly ever, no binge drinking Tobacco: No, never a user SA: None, and no history of substance abuse or dependence.    Medical History: No past medical history on file.  Current medications:  Outpatient Encounter Medications as of 03/03/2018  Medication Sig  . ibuprofen (ADVIL,MOTRIN) 800 MG tablet Take 1 tablet (800 mg total) by mouth 3 (three) times daily.  . Loratadine (CLARITIN) 10 MG CAPS  Take by mouth.   No facility-administered encounter medications on file as of 03/03/2018.      Current Examination:  Behavioral Observations:  Appearance: Casually and appropriately dressed and groomed Gait: Ambulated independently, no gross abnormalities observed Speech: Fluent; normal rate, rhythm and volume. No word finding difficulty. No stuttering during the interview but he did demonstrate increased response latencies. Thought process: Linear, goal directed Affect: Full, appears euthymic Interpersonal: Pleasant, appropriate Orientation: Oriented to person, place and most aspects of time (one day off on the date). Accurately named the current President and his predecessor.   Tests Administered: . Test of Premorbid Functioning (TOPF) . Wechsler Adult Intelligence Scale-Fourth Edition (WAIS-IV): Similarities, Information, Block Design, Matrix Reasoning, Arithmetic, Symbol Search, Coding and Digit Span subtests . Wechsler Memory Scale-Fourth Edition (WMS-IV) Adult Version (ages 74-69): Logical Memory I, II and Recognition subtests  . [place] Verbal Learning Test - 2nd Edition (CVLT-2) Standard Form . Rey Complex Figure Test (RCFT) . Controlled Oral Word Association Test (COWAT) . Trail Making Test A and B . Boston Diagnostic Aphasia Examination (BDAE): Repetition of Words and Phrases subtests and Commands Subtest . Neuropsychological Assessment Battery (NAB) Language Module; Form 1: Naming subtest . Beck Depression Inventory - Second edition (BDI-II) . Generalized Anxiety Disorder - 7 item screener (GAD-7) . Personality Assessment Inventory (PAI)  Test Results: Note: Standardized scores are presented only for use by appropriately trained professionals and to allow for any future test-retest comparison. These scores should not be interpreted without consideration of all the information that is contained in the rest of the report. The most recent standardization samples from the  test publisher or other sources were used whenever possible to derive standard scores; scores were corrected for age, gender, ethnicity and education when available.   Test Scores:   Test Name Raw Score Standardized Score Descriptor  TOPF 43/70 SS= 105 Average  WAIS-IV Subtests     Similarities 24/36 ss= 10 Average  Information 19/26 ss= 14 Superior  Block Design 56/66 ss= 13 High average  Matrix Reasoning 23/26 ss= 13 High average  Arithmetic 16/22 ss= 12 High average  Symbol Search 36/60 ss= 11 Average  Coding 79/135 ss= 11 Average  Digit Span 32/48 ss= 12 High average  WAIS-IV Index Scores     Verbal Comprehension  SS= 110 High average  Perceptual Reasoning  SS= 117 High average  Working Memory  SS= 111 High average  Processing Speed  SS= 105 Average  Full Scale IQ (8 subtest)  SS= 113 High average  WMS-IV Subtests     LM I 32/50 ss= 13 High average  LM II 29/50 ss= 12 High average  LM II Recognition 23/30 Cum %: 26-50   CVLT-II Scores     Trial 1 6/16 Z= -0.5 Average  Trial 5 15/16 Z= 1 High average  Trials 1-5 total 63/80 T= 64 Superior  SD Free Recall 15/16 Z= 1.5 Superior  SD Cued Recall 15/16 Z=  1 High average  LD Free Recall 15/16 Z= 1 High average  LD Cued Recall 15/16 Z= 1 High average  Recognition Discriminability 15/16 hits; 0 false positives Z= 0.5 Average  Forced Choice Recognition 16/16  WNL  RCFT     Copy 35/36 >16%ile WNL  3' Recall 28.5/36 T= 59 High average  30' Recall 28.5/36 T= 58 High average  Recognition 23/24 T= 59 High average  COWAT-FAS 50 T= 55 Average  COWAT-Animals 24 T= 54 Average  Trail Making Test A  19" 0 errors T= 59 High average  Trail Making Test B  39" 0 errors T= 62 High average  BDAE Subtests     Repetition of Words 10/10  WNL  Repetition of Phrases Col 1: 5/5 Col 2: 5/5  WNL WNL  Commands 15/15  WNL  NAB Language Naming 30/31 T= 57 High average  BDI-II 10/63  WNL  GAD-7 2/21  WNL  PAI (Only elevated clinical scale is  shown here)     PAR  T= 70      Description of Test Results:  Premorbid verbal intellectual abilities were estimated to have been within the average range based on a test of word reading. Current full scale IQ fell within the high average range.   Psychomotor processing speed was average to high average. Auditory attention and working memory were high average. Visual-spatial construction was high average. Language abilities were intact. Specifically, confrontation naming was high average, and semantic verbal fluency was average. Repetition of words and phrases was intact. Auditory comprehension of multi-step commands was intact. With regard to verbal memory, encoding and acquisition of non-contextual information (i.e., word list) was superior. After a brief interference task, free recall was superior. After a delay, free recall was high average. Cued recall was high average. Performance on a yes/no recognition task was average. On another verbal memory test, encoding and acquisition of contextual auditory information (i.e., short stories) was high average. After a delay, free recall was high average. Performance on a yes/no recognition task was within normal limits. With regard to non-verbal memory, delayed free recall of visual information was high average. Performance on a yes/no recognition task was high average. Executive functioning was within normal limits. Mental flexibility and set-shifting were high average on Trails B. Verbal fluency with phonemic search restrictions was average. Verbal abstract reasoning was average. Non-verbal abstract reasoning was high average.   On a a self-report measure of mood, the patient's responses were not indicative of clinically significant depression at the present time. Depressive symptoms endorsed included: pessimism about the future, feelings of failure, guilty feelings, restlessness, reduced sleep, irritability, reduced appetite, concentration difficulty. He  denied suicidal ideation or intention. On a self-report measure of anxiety, the patient did not endorse clinically significant generalized anxiety at the present time.   On a more extensive measure assessing for psychopathology and personality disorders (PAI), his clinical profile is marked by a significant elevation on the Paranoia scale, indicating that the content tapped by this scale may reflect a particular area of difficulty for the patient. The patient's self-description indicates significant suspiciousness and hostility in his relations with others.  He is likely to be a hypervigilant individual who often questions and doubts the motives of those around him.  Although he may not describe himself as unduly suspicious, others are likely to view him as very sensitive and easily insulted in his interactions.  As a result, working relationships with others are likely to be strained and may require  an unusual degree of support and assistance in order to succeed. The patient describes himself as a socially isolated individual who has few interpersonal relationships that could be described as close and warm.  He may have difficulty interpreting the normal nuances of interpersonal behavior that provide the meaning to personal relationships.  His social isolation and detachment may serve to decrease a sense of discomfort that interpersonal contact fosters. According to the patient's self-report, he describes NO significant problems in the following areas: antisocial behavior; problems with empathy; extreme moodiness and impulsivity; unhappiness and depression; unusually elevated mood or heightened activity; marked anxiety; problematic behaviors used to manage anxiety; difficulties with health or physical functioning.  Also, he reports NO significant problems with alcohol or drug abuse or dependence.  With respect to suicidal ideation, the patient is NOT reporting distress from thoughts of self-harm.   Clinical  Impressions: No evidence of neurocognitive disorder. Results of cognitive testing were entirely within normal limits. There was no indication of decline or impairment in any of the cognitive domains tested, including processing speed, auditory attention, working memory, language, visual-spatial skills, learning and memory and executive functioning. There is no evidence to suggest an underlying neurocognitive disorder, and no indication of cognitive sequelae of any possible concussion.  Psychological testing suggested the possibility of schizoid personality traits. It is possible that discomfort in social situations could contribute to his experience of stuttering as well as his information retrieval difficulty. I suspect his stuttering symptoms are functional rather than organic/neurologic. Further psychological evaluation and treatment may be beneficial.  I did not observe any stuttering during the current evaluation, but if there is ongoing concern about a speech impairment, evaluation by a SLP could also be considered. While psychological testing did not indicate significant depression, the patient did indicate during the clinical interview that he is having difficulty with irritability that comes over him and for which he cannot always identify a cause. Again, psychological evaluation/treatment may be useful to further explore and address this. If he is amenable, I am happy to refer him to [facility], or he could consider seeing a counselor at his college counseling center. The patient will be reassured that his testing results were entirely within normal limits, with many performances falling above average for his age.     Feedback to Patient: Leroy Herman returned for a feedback appointment on 03/03/2018 to review the results of his neuropsychological evaluation with this provider. 15 minutes face-to-face time was spent reviewing his test results, my impressions and my  recommendations as detailed above.    Total time spent on this patient's case: 80 minutes for neurobehavioral status exam with psychologist (CPT code 16[address]); 210 minutes of testing/scoring by psychometrician under psychologist's supervision (CPT codes [phone], [phone] units); 220 minutes for integration of patient data, interpretation of standardized test results and clinical data, clinical decision making, treatment planning and preparation of this report, and interactive feedback with review of results to the patient/family by psychologist (CPT codes [phone], [phone] units).      Thank you for your referral of Leroy Herman. Please feel free to contact me if you have any questions or concerns regarding this report.

## 2018-03-02 ENCOUNTER — Encounter: Payer: BC Managed Care – PPO | Admitting: Psychology

## 2018-03-03 ENCOUNTER — Ambulatory Visit (INDEPENDENT_AMBULATORY_CARE_PROVIDER_SITE_OTHER): Payer: BC Managed Care – PPO | Admitting: Psychology

## 2018-03-03 ENCOUNTER — Encounter: Payer: Self-pay | Admitting: Psychology

## 2018-03-03 DIAGNOSIS — R413 Other amnesia: Secondary | ICD-10-CM

## 2018-03-03 NOTE — Patient Instructions (Signed)
You did excellent on the testing!! All performances were within normal limits, with many abilities falling above average. There is no evidence to suggest underlying neurocognitive disorder and no indication of cognitive effects of any prior concussion.  I'm not sure why the stuttering spells are happening, but I don't think they represent a neurologic disorder. It could possibly be related to anxiety.  We did talk about some of the mood/irritability issues you have noticed. While I don't think this is a severe problem, you could consider seeing a counselor at your school counseling center to gain some more awareness into this and strategies for coping.

## 2018-03-18 NOTE — Progress Notes (Signed)
[facility] [address] [place], [place] [address] Phone: [phone] Subjective:       CC: Neck pain  BJY:[id number]  Leroy Herman is a 22 y.o. male coming in with complaint of neck pain. Has not been playing football since last visit nor has he worked out. He has not had any other episodes of muscle weakness and giving way since last visit.  Patient has had work-up recently including neuropsychological testing for his memory loss.  Showed anxiety and laboratory work-up only showing the patient did have some mild low vitamin D.  Patient has had work-up including MRI of the cervical spine as well as MRI of the brain without any findings.     No past medical history on file. Past Surgical History:  Procedure Laterality Date  . WISDOM TOOTH EXTRACTION     Social History   Socioeconomic History  . Marital status: Single    Spouse name: Not on file  . Number of children: Not on file  . Years of education: Not on file  . Highest education level: Not on file  Occupational History  . Not on file  Social Needs  . Financial resource strain: Not on file  . Food insecurity:    Worry: Not on file    Inability: Not on file  . Transportation needs:    Medical: Not on file    Non-medical: Not on file  Tobacco Use  . Smoking status: Never Smoker  . Smokeless tobacco: Never Used  Substance and Sexual Activity  . Alcohol use: No  . Drug use: Not on file  . Sexual activity: Not on file  Lifestyle  . Physical activity:    Days per week: Not on file    Minutes per session: Not on file  . Stress: Not on file  Relationships  . Social connections:    Talks on phone: Not on file    Gets together: Not on file    Attends religious service: Not on file    Active member of club or organization: Not on file    Attends meetings of clubs or organizations: Not on file    Relationship status: Not on file  Other Topics Concern  . Not on file  Social History  Narrative   Pt lives in college dorm at [employer]   Has 3 dorm-mates (single room occupancy 4 total to dorm)   No Known Allergies No family history on file. no family hx   Past medical history, social, surgical and family history all reviewed in electronic medical record.  No pertanent information unless stated regarding to the chief complaint.   Review of Systems:Review of systems updated and as accurate as of 03/19/18  No headache, visual changes, nausea, vomiting, diarrhea, constipation, dizziness, abdominal pain, skin rash, fevers, chills, night sweats, weight loss, swollen lymph nodes, body aches, joint swelling, muscle aches, chest pain, shortness of breath, mood changes.  Positive memory loss patient still states  Objective  Blood pressure 118/72, pulse 72, height  (1.676 m), weight 175 lb (79.4 kg), SpO2 98 %. Systems examined below as of 03/19/18   General: No apparent distress alert and oriented x3 mood and affect normal, dressed appropriately.  HEENT: Pupils equal, extraocular movements intact  Respiratory: Patient's speak in full sentences and does not appear short of breath  Cardiovascular: No lower extremity edema, non tender, no erythema  Skin: Warm dry intact with no signs of infection or rash  on extremities or on axial skeleton.  Abdomen: Soft nontender  Neuro: Cranial nerves II through XII are intact, neurovascularly intact in all extremities with 2+ DTRs and 2+ pulses.  Lymph: No lymphadenopathy of posterior or anterior cervical chain or axillae bilaterally.  Gait normal with good balance and coordination.  MSK:  Non tender with full range of motion and good stability and symmetric strength and tone of shoulders, elbows, wrist, hip, knee and ankles bilaterally.   Neck: Inspection unremarkable. No palpable stepoffs. Negative Spurling's maneuver. Full neck range of motion Grip strength and sensation normal in bilateral hands Strength good C4 to T1  distribution No sensory change to C4 to T1 Negative Hoffman sign bilaterally Reflexes normal    Impression and Recommendations:     This case required medical decision making of moderate complexity.      Note: This dictation was prepared with Dragon dictation along with smaller phrase technology. Any transcriptional errors that result from this process are unintentional.

## 2018-03-19 ENCOUNTER — Encounter: Payer: Self-pay | Admitting: Family Medicine

## 2018-03-19 ENCOUNTER — Ambulatory Visit (INDEPENDENT_AMBULATORY_CARE_PROVIDER_SITE_OTHER): Payer: BLUE CROSS/BLUE SHIELD | Admitting: Family Medicine

## 2018-03-19 DIAGNOSIS — F411 Generalized anxiety disorder: Secondary | ICD-10-CM | POA: Diagnosis not present

## 2018-03-19 DIAGNOSIS — G47411 Narcolepsy with cataplexy: Secondary | ICD-10-CM

## 2018-03-19 MED ORDER — VITAMIN D (ERGOCALCIFEROL) 1.25 MG (50000 UNIT) PO CAPS
50000.0000 [IU] | ORAL_CAPSULE | ORAL | 0 refills | Status: DC
Start: 2018-03-19 — End: 2019-12-03

## 2018-03-19 MED ORDER — VENLAFAXINE HCL ER 37.5 MG PO CP24
37.5000 mg | ORAL_CAPSULE | Freq: Every day | ORAL | 1 refills | Status: DC
Start: 2018-03-19 — End: 2019-12-03

## 2018-03-19 NOTE — Assessment & Plan Note (Addendum)
Patient has had thorough work-up for this at this time.  MRI of the cervical spine as well as MRI of the brain has been independently visualized by me showing no significant findings.  Patient has had significant work-up as well for neurologic component as well.  Patient has had cardiology work-up as well.  Everything has been unremarkable.  Patient did have neuropsychological testing that did not point to the possibility of some underlying anxiety that could be contributing to some of his anxiousness.  Patient is doing very well and is very functional with patient doing very well in school.  We discussed all that Effexor could be beneficial.  Patient will try this.  Treatment for underlying general anxiety disorder.  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

## 2018-03-19 NOTE — Assessment & Plan Note (Signed)
Started effexor.

## 2018-03-19 NOTE — Patient Instructions (Signed)
Good to see you  At this time we do not have a great reason why this is happening.  I am going to send in once weekly vitamin D to bump that up  Effexor daily could help if anxiety is playing a role.  I think it is ok to start working out and lets see how it goes.  Then see me again in 4 weeks

## 2018-04-16 ENCOUNTER — Ambulatory Visit: Payer: BLUE CROSS/BLUE SHIELD | Admitting: Family Medicine

## 2018-09-18 IMAGING — DX DG CERVICAL SPINE WITH FLEX & EXTEND
7 series · 7 of 7 positions shown · non-contrast
Comparison: 11/08/2014

CLINICAL DATA: Cervical radiculopathy, sports related injury from
football

EXAM:
CERVICAL SPINE COMPLETE WITH FLEXION AND EXTENSION VIEWS

[c-spine lat]
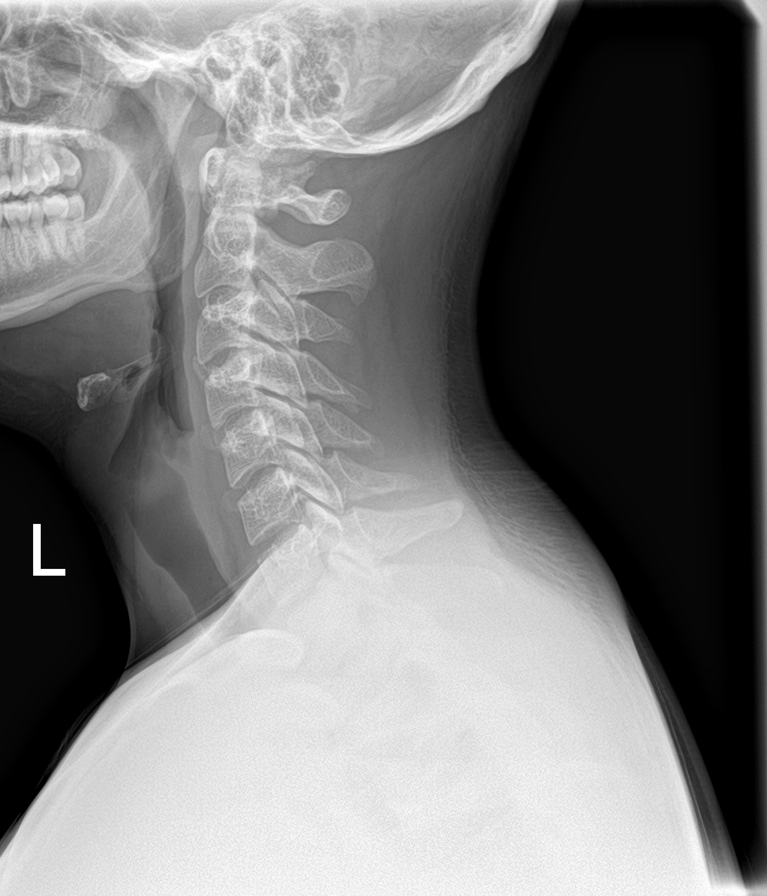

[c-spine flex]
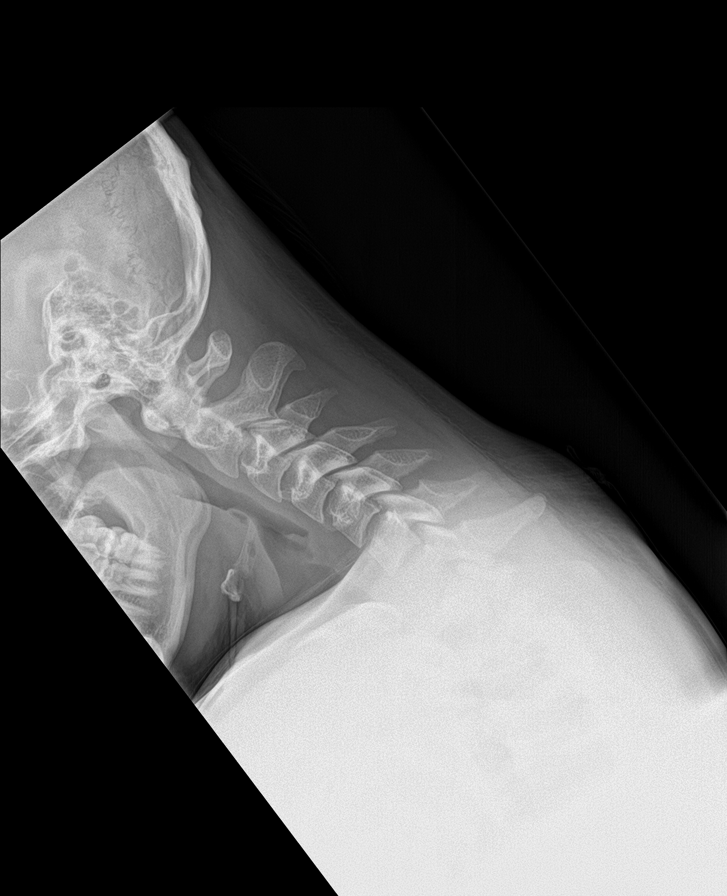

[c-spine ext]
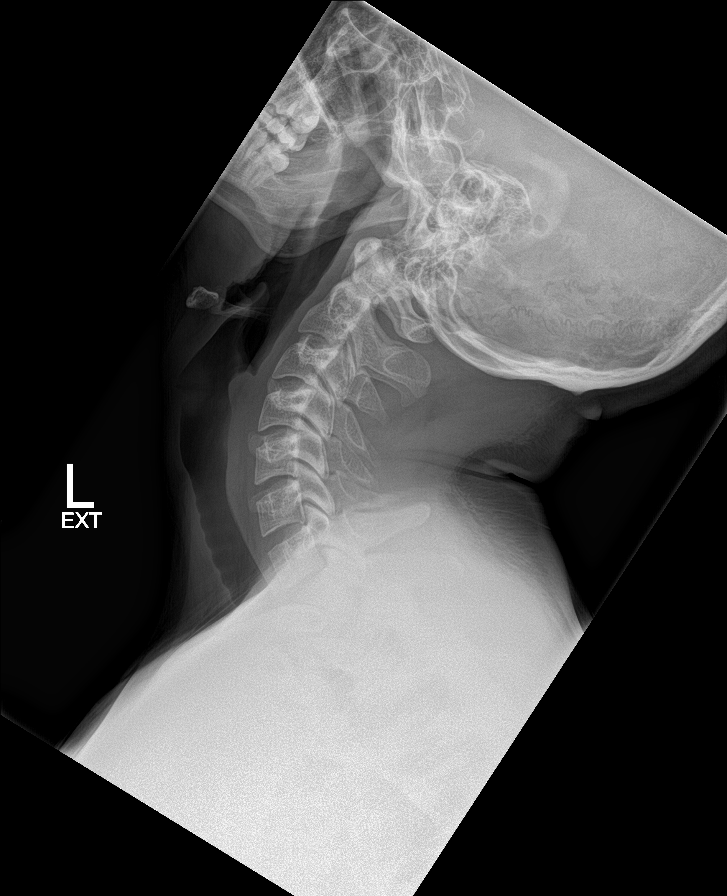

[c-spine obl (1 of 2)]
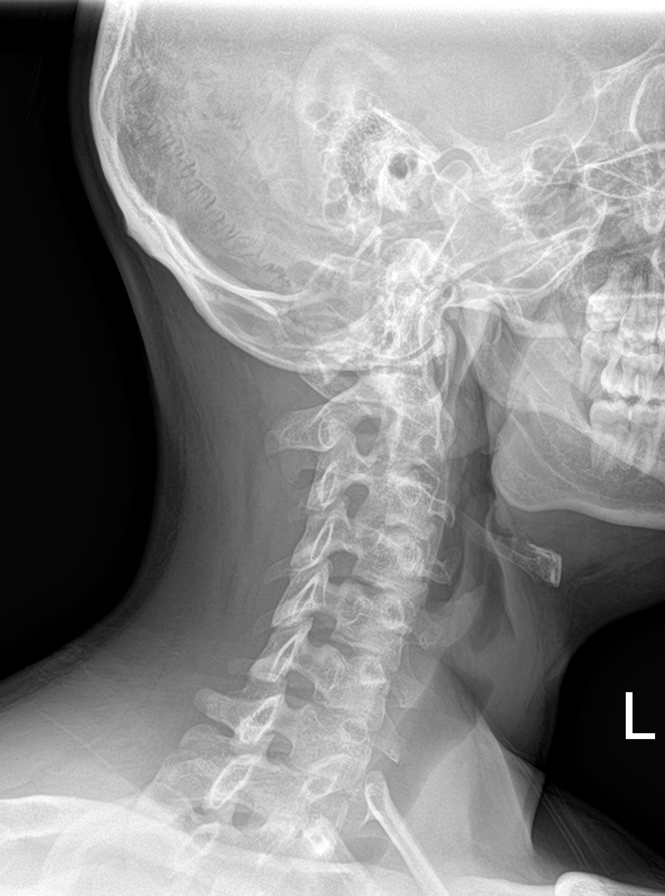

[c-spine obl (2 of 2)]
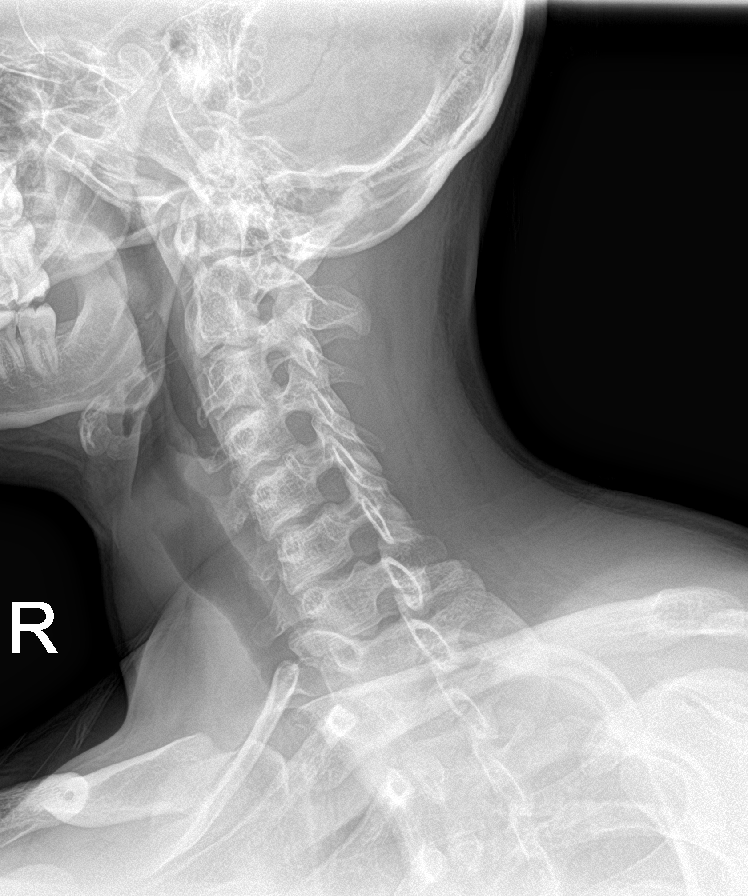

[c-spine ap]
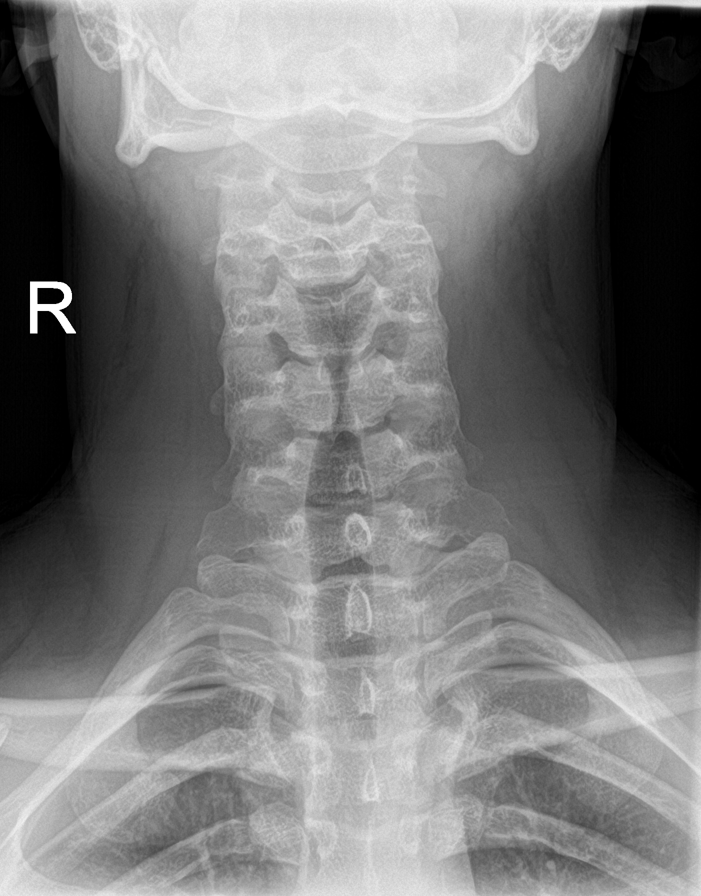

[c-spine open mouth]
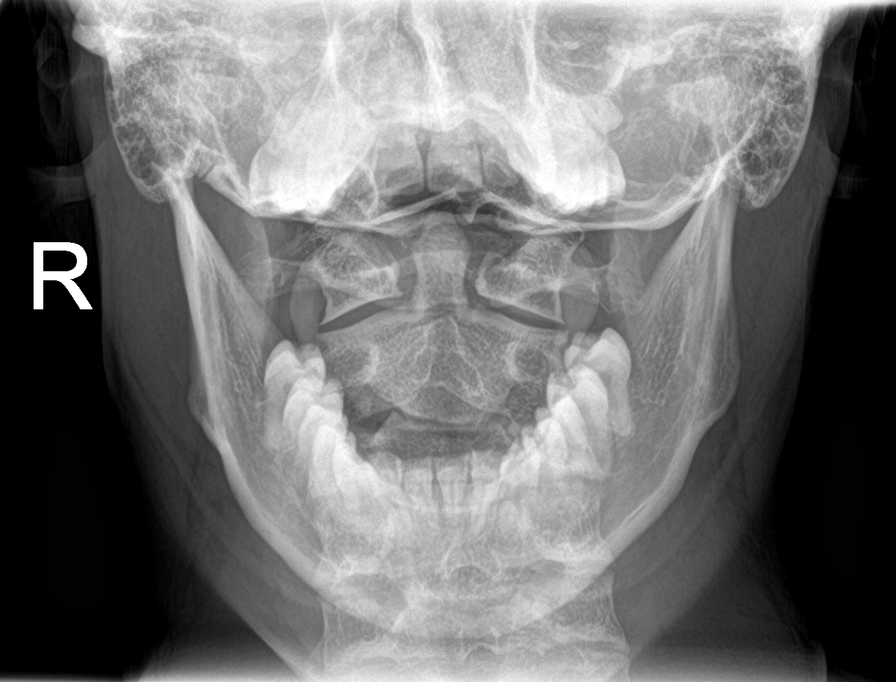

[7 of 7 positions shown; findings below may reference images not displayed]

FINDINGS: Prevertebral soft tissues normal thickness.

Osseous mineralization normal.

Vertebral body and disc space heights maintained.

No acute fracture, subluxation or bone destruction.

Bony foramina patent.

C1-C2 alignment normal.

No abnormal motion demonstrated with flexion or extension.

Lung apices clear.
IMPRESSION: Normal exam.

## 2019-06-18 DIAGNOSIS — Z20828 Contact with and (suspected) exposure to other viral communicable diseases: Secondary | ICD-10-CM | POA: Diagnosis not present

## 2019-06-18 DIAGNOSIS — Z7189 Other specified counseling: Secondary | ICD-10-CM | POA: Diagnosis not present

## 2019-08-10 DIAGNOSIS — Z20828 Contact with and (suspected) exposure to other viral communicable diseases: Secondary | ICD-10-CM | POA: Diagnosis not present

## 2019-08-20 DIAGNOSIS — Z7189 Other specified counseling: Secondary | ICD-10-CM | POA: Diagnosis not present

## 2019-08-20 DIAGNOSIS — Z20828 Contact with and (suspected) exposure to other viral communicable diseases: Secondary | ICD-10-CM | POA: Diagnosis not present

## 2019-08-20 DIAGNOSIS — Z03818 Encounter for observation for suspected exposure to other biological agents ruled out: Secondary | ICD-10-CM | POA: Diagnosis not present

## 2019-09-10 DIAGNOSIS — Z20828 Contact with and (suspected) exposure to other viral communicable diseases: Secondary | ICD-10-CM | POA: Diagnosis not present

## 2019-09-10 DIAGNOSIS — Z7189 Other specified counseling: Secondary | ICD-10-CM | POA: Diagnosis not present

## 2019-11-16 ENCOUNTER — Ambulatory Visit: Payer: BC Managed Care – PPO | Attending: Internal Medicine

## 2019-11-16 DIAGNOSIS — Z20822 Contact with and (suspected) exposure to covid-19: Secondary | ICD-10-CM

## 2019-11-18 LAB — NOVEL CORONAVIRUS, NAA: SARS-CoV-2, NAA: NOT DETECTED

## 2019-12-03 ENCOUNTER — Encounter: Payer: Self-pay | Admitting: Family

## 2019-12-03 ENCOUNTER — Ambulatory Visit (INDEPENDENT_AMBULATORY_CARE_PROVIDER_SITE_OTHER): Payer: BC Managed Care – PPO | Admitting: Family

## 2019-12-03 ENCOUNTER — Other Ambulatory Visit: Payer: Self-pay

## 2019-12-03 DIAGNOSIS — T7840XA Allergy, unspecified, initial encounter: Secondary | ICD-10-CM

## 2019-12-03 DIAGNOSIS — Z7689 Persons encountering health services in other specified circumstances: Secondary | ICD-10-CM | POA: Diagnosis not present

## 2019-12-03 NOTE — Assessment & Plan Note (Signed)
Somewhat poor control.  Advised he be more consistent with Claritin and to start taking Flonase over-the-counter as well particularly in the seasons when his allergies are more bothersome.  He verbalized understanding.

## 2019-12-03 NOTE — Assessment & Plan Note (Signed)
Reviewed past medical history.  Advised patient to return for CPE.  He verbalized understanding

## 2019-12-03 NOTE — Progress Notes (Signed)
Virtual Visit via Video Note  I connected with@  on 12/03/19 at  9:00 AM EST by a video enabled telemedicine application and verified that I am speaking with the correct person using two identifiers.  Location patient: home Location provider:work  Persons participating in the virtual visit: patient, provider  I discussed the limitations of evaluation and management by telemedicine and the availability of in person appointments. The patient expressed understanding and agreed to proceed.   HPI:  Establish care.  hasnt had a pcp in years. Had physical with college football. Thinking of going [employer].    Doing well.  Complains of  'allergies' and nasal congestion. No fever, sinus pressure, ear pain, cough . Takes claritin prn.   H/o multiple concussion with football, resolved now.   Doing well in school and plans to play football in school if covid permits. No CP, sob, wheezing.   Has seen cervical radiculopathy and seen Dr Tamala Julian in the past.Symptom resolved.   Never started effexor.   H/o cold sores on mouth. Takes valtrex for this.   ROS: See pertinent positives and negatives per HPI.  History reviewed. No pertinent past medical history.  Past Surgical History:  Procedure Laterality Date  . WISDOM TOOTH EXTRACTION      Family History  Problem Relation Age of Onset  . Migraines Mother   . Prostate cancer Neg Hx   . Colon cancer Neg Hx     SOCIAL HX: never smoker   Current Outpatient Medications:  .  ibuprofen (ADVIL,MOTRIN) 800 MG tablet, Take 1 tablet (800 mg total) by mouth 3 (three) times daily., Disp: 50 tablet, Rfl: 0 .  Loratadine (CLARITIN) 10 MG CAPS, Take by mouth., Disp: , Rfl:  .  valACYclovir (VALTREX) 1000 MG tablet, Take 2,000 mg by mouth every 12 (twelve) hours as needed. Take asap after sxs onset, Disp: , Rfl:   EXAM:  VITALS per patient if applicable:  GENERAL: alert, oriented, appears well and in no acute distress  HEENT: atraumatic,  conjunttiva clear, no obvious abnormalities on inspection of external nose and ears  NECK: normal movements of the head and neck  LUNGS: on inspection no signs of respiratory distress, breathing rate appears normal, no obvious gross SOB, gasping or wheezing  CV: no obvious cyanosis  MS: moves all visible extremities without noticeable abnormality  PSYCH/NEURO: pleasant and cooperative, no obvious depression or anxiety, speech and thought processing grossly intact  ASSESSMENT AND PLAN:  Discussed the following assessment and plan:  Allergy, initial encounter  Encounter to establish care Problem List Items Addressed This Visit      Other   Allergies    Somewhat poor control.  Advised he be more consistent with Claritin and to start taking Flonase over-the-counter as well particularly in the seasons when his allergies are more bothersome.  He verbalized understanding.      Encounter to establish care    Reviewed past medical history.  Advised patient to return for CPE.  He verbalized understanding         -we discussed possible serious and likely etiologies, options for evaluation and workup, limitations of telemedicine visit vs in person visit, treatment, treatment risks and precautions. Pt prefers to treat via telemedicine empirically rather then risking or undertaking an in person visit at this moment. Patient agrees to seek prompt in person care if worsening, new symptoms arise, or if is not improving with treatment.   I discussed the assessment and treatment plan with the patient. The  patient was provided an opportunity to ask questions and all were answered. The patient agreed with the plan and demonstrated an understanding of the instructions.   The patient was advised to call back or seek an in-person evaluation if the symptoms worsen or if the condition fails to improve as anticipated.   Rennie Plowman, FNP

## 2020-01-13 ENCOUNTER — Telehealth: Payer: Self-pay | Admitting: Family

## 2020-01-13 MED ORDER — VALACYCLOVIR HCL 1 G PO TABS: 2000.0000 mg | ORAL_TABLET | Freq: Two times a day (BID) | ORAL | 0 refills | Status: DC | PRN

## 2020-01-13 NOTE — Telephone Encounter (Signed)
Pt needs refill on valACYclovir (VALTREX) 1000 MG tablet. Pt is about out

## 2020-01-21 DIAGNOSIS — Z113 Encounter for screening for infections with a predominantly sexual mode of transmission: Secondary | ICD-10-CM | POA: Diagnosis not present

## 2020-02-08 DIAGNOSIS — Z03818 Encounter for observation for suspected exposure to other biological agents ruled out: Secondary | ICD-10-CM | POA: Diagnosis not present

## 2020-06-02 ENCOUNTER — Encounter: Payer: BC Managed Care – PPO | Admitting: Family

## 2020-06-23 ENCOUNTER — Other Ambulatory Visit: Payer: Self-pay | Admitting: Family

## 2020-06-23 MED ORDER — VALACYCLOVIR HCL 1 G PO TABS: 2000.0000 mg | ORAL_TABLET | Freq: Two times a day (BID) | ORAL | 0 refills | Status: DC | PRN

## 2020-06-23 NOTE — Telephone Encounter (Signed)
Patient called with information; [address], [place], [place] [address] [phone]

## 2020-06-23 NOTE — Telephone Encounter (Signed)
Leroy Herman, can you handle this refill?

## 2020-06-23 NOTE — Telephone Encounter (Signed)
Catering manager # [phone]. Please call patient when sent to pharmacy.

## 2020-06-23 NOTE — Telephone Encounter (Signed)
Patient called in  need refill on valACYclovir (VALTREX) 1000 MG tablet also he is changing [facility] store he calling back with store number

## 2020-06-23 NOTE — Telephone Encounter (Signed)
Left message for patient to return call back. Need more info. Address, phone number. Cant find store number in epic

## 2020-06-26 ENCOUNTER — Other Ambulatory Visit: Payer: Self-pay

## 2020-06-26 MED ORDER — VALACYCLOVIR HCL 1 G PO TABS: 2000.0000 mg | ORAL_TABLET | Freq: Two times a day (BID) | ORAL | 0 refills | Status: AC | PRN

## 2022-04-26 ENCOUNTER — Telehealth: Payer: Self-pay
